# Patient Record
Sex: Male | Born: 1975 | Race: Black or African American | Hispanic: No | Marital: Single | Smoking: Never smoker
Health system: Southern US, Community
[De-identification: ages and names within clinical notes are randomized; demographics above are authoritative.]

## PROBLEM LIST (undated history)

## (undated) DIAGNOSIS — D689 Coagulation defect, unspecified: Secondary | ICD-10-CM

## (undated) DIAGNOSIS — F329 Major depressive disorder, single episode, unspecified: Secondary | ICD-10-CM

## (undated) DIAGNOSIS — Z86711 Personal history of pulmonary embolism: Secondary | ICD-10-CM

## (undated) DIAGNOSIS — D6859 Other primary thrombophilia: Principal | ICD-10-CM

## (undated) DIAGNOSIS — F429 Obsessive-compulsive disorder, unspecified: Secondary | ICD-10-CM

## (undated) DIAGNOSIS — I2699 Other pulmonary embolism without acute cor pulmonale: Secondary | ICD-10-CM

## (undated) DIAGNOSIS — F419 Anxiety disorder, unspecified: Secondary | ICD-10-CM

## (undated) DIAGNOSIS — F32A Depression, unspecified: Secondary | ICD-10-CM

## (undated) HISTORY — DX: Obsessive-compulsive disorder, unspecified: F42.9

## (undated) HISTORY — DX: Other primary thrombophilia: D68.59

## (undated) HISTORY — DX: Anxiety disorder, unspecified: F41.9

## (undated) HISTORY — DX: Coagulation defect, unspecified: D68.9

## (undated) HISTORY — DX: Major depressive disorder, single episode, unspecified: F32.9

## (undated) HISTORY — DX: Depression, unspecified: F32.A

## (undated) HISTORY — DX: Personal history of pulmonary embolism: Z86.711

## (undated) HISTORY — DX: Other pulmonary embolism without acute cor pulmonale: I26.99

## (undated) HISTORY — PX: PILONIDAL CYST EXCISION: SHX744

---

## 2017-04-01 ENCOUNTER — Ambulatory Visit (INDEPENDENT_AMBULATORY_CARE_PROVIDER_SITE_OTHER): Payer: Medicare Other | Admitting: Emergency Medicine

## 2017-04-01 VITALS — BP 115/74 | HR 93 | Temp 98.4°F | Resp 18 | Wt 212.8 lb

## 2017-04-01 DIAGNOSIS — D689 Coagulation defect, unspecified: Secondary | ICD-10-CM

## 2017-04-01 DIAGNOSIS — R29898 Other symptoms and signs involving the musculoskeletal system: Secondary | ICD-10-CM

## 2017-04-01 NOTE — Patient Instructions (Signed)
     IF you received an x-ray today, you will receive an invoice from [facility]. Please contact [facility] at [phone] with questions or concerns regarding your invoice.   IF you received labwork today, you will receive an invoice from [facility]. Please contact [facility] at [phone] with questions or concerns regarding your invoice.   Our billing staff will not be able to assist you with questions regarding bills from these companies.  You will be contacted with the lab results as soon as they are available. The fastest way to get your results is to activate your My Chart account. Instructions are located on the last page of this paperwork. If you have not heard from us regarding the results in 2 weeks, please contact this office.     

## 2017-04-01 NOTE — Progress Notes (Signed)
Janie Morning 41 y.o.   Chief Complaint  Patient presents with  . Arm Pain    right arm   feels weak/numb x week     HISTORY OF PRESENT ILLNESS: This is a 41 y.o. male complaining of weakness to right arm x 1-2 weeks; right biceps area feels a gripping sensation. Denies headache or any other neurological symptoms. Has h/o clotting disorder; had PE in the past; on Coumadin for life, started 8 years ago.  HPI   Prior to Admission medications   Medication Sig Start Date End Date Taking? Authorizing Provider  alprazolam Prudy Feeler) 2 MG tablet Take 2 mg by mouth at bedtime as needed for sleep.   Yes Historical Provider, MD  buPROPion (WELLBUTRIN XL) 150 MG 24 hr tablet Take 150 mg by mouth daily.   Yes Historical Provider, MD  pantoprazole (PROTONIX) 40 MG tablet Take 40 mg by mouth daily.   Yes Historical Provider, MD  Vilazodone HCl (VIIBRYD) 40 MG TABS Take 40 mg by mouth daily.   Yes Historical Provider, MD  warfarin (COUMADIN) 1 MG tablet Take 9 mg by mouth daily.   Yes Historical Provider, MD  warfarin (COUMADIN) 6 MG tablet Take 6 mg by mouth daily.   Yes Historical Provider, MD    No Known Allergies  Patient Active Problem List   Diagnosis Date Noted  . Right arm weakness 04/01/2017  . Clotting disorder (HCC) 04/01/2017    Past Medical History:  Diagnosis Date  . Anxiety   . Clotting disorder (HCC)   . Depression   . OCD (obsessive compulsive disorder)   . Pulmonary embolism [facility])     Past Surgical History:  Procedure Laterality Date  . PILONIDAL CYST EXCISION      Social History   Social History  . Marital status: Single    Spouse name: N/A  . Number of children: N/A  . Years of education: N/A   Occupational History  . Not on file.   Social History Main Topics  . Smoking status: Never Smoker  . Smokeless tobacco: Never Used  . Alcohol use No  . Drug use: Unknown  . Sexual activity: Not on file   Other Topics Concern  . Not on file   Social  History Narrative  . No narrative on file    Family History  Problem Relation Age of Onset  . Heart disease Mother   . Hypertension Mother   . Diabetes Father   . Heart disease Sister   . Stroke Sister   . Heart disease Maternal Grandmother   . Heart disease Maternal Grandfather   . Hypertension Maternal Grandfather   . Cancer Paternal Grandmother   . Diabetes Paternal Grandmother      Review of Systems  Constitutional: Negative.  Negative for chills, fever, malaise/fatigue and weight loss.  HENT: Negative.  Negative for congestion, ear pain, nosebleeds, sinus pain and sore throat.   Eyes: Negative.  Negative for blurred vision, double vision, discharge and redness.  Respiratory: Negative.  Negative for cough and shortness of breath.   Cardiovascular: Negative.  Negative for chest pain, palpitations, claudication and leg swelling.  Gastrointestinal: Negative.  Negative for abdominal pain, diarrhea, nausea and vomiting.  Genitourinary: Negative.  Negative for hematuria.  Musculoskeletal: Negative.  Negative for back pain, myalgias and neck pain.  Skin: Negative.  Negative for rash.  Neurological: Positive for focal weakness (right arm). Negative for dizziness, tingling, tremors, sensory change, speech change, seizures, loss of consciousness and  headaches.  Endo/Heme/Allergies: Bruises/bleeds easily.  Psychiatric/Behavioral: Negative.   All other systems reviewed and are negative.  Vitals:   04/01/17 1747  BP: 115/74  Pulse: 93  Resp: 18  Temp: 98.4 F (36.9 C)     Physical Exam  Constitutional: He is oriented to person, place, and time. He appears well-developed and well-nourished.  HENT:  Head: Normocephalic and atraumatic.  Nose: Nose normal.  Mouth/Throat: Oropharynx is clear and moist. No oropharyngeal exudate.  Eyes: Conjunctivae and EOM are normal. Pupils are equal, round, and reactive to light.  Neck: Normal range of motion. Neck supple. No JVD present. No  thyromegaly present.  Cardiovascular: Normal rate, regular rhythm and normal heart sounds.   Pulmonary/Chest: Effort normal and breath sounds normal.  Abdominal: Soft. He exhibits no distension. There is no tenderness.  Musculoskeletal: Normal range of motion.  Neurological: He is alert and oriented to person, place, and time. He displays no tremor and normal reflexes. No cranial nerve deficit or sensory deficit. He exhibits normal muscle tone. Coordination normal.  Reflex Scores:      Tricep reflexes are 2+ on the right side and 2+ on the left side.      Bicep reflexes are 2+ on the right side and 2+ on the left side.      Brachioradialis reflexes are 2+ on the right side and 2+ on the left side.      Patellar reflexes are 2+ on the right side and 2+ on the left side.      Achilles reflexes are 2+ on the right side and 2+ on the left side. Mild right arm weakness 4/5; slightly decreased grip on right hand.  Skin: Skin is warm. Capillary refill takes less than 2 seconds. No rash noted.  Psychiatric: He has a normal mood and affect. His behavior is normal.  Vitals reviewed.    ASSESSMENT & PLAN: Ying was seen today for arm pain.  Diagnoses and all orders for this visit:  Right arm weakness -     Ambulatory referral to Neurology -     CT Head Wo Contrast; Future -     CT CERVICAL SPINE WO CONTRAST; Future -     VAS [place] UPPER EXTREMITY VENOUS DUPLEX; Future  Clotting disorder (HCC)      Edwina Barth, MD [facility]

## 2017-04-02 ENCOUNTER — Ambulatory Visit: Payer: Self-pay

## 2017-04-06 ENCOUNTER — Encounter: Payer: Self-pay | Admitting: Emergency Medicine

## 2017-04-09 ENCOUNTER — Encounter: Payer: Self-pay | Admitting: Neurology

## 2017-04-09 ENCOUNTER — Encounter: Payer: Self-pay | Admitting: Emergency Medicine

## 2017-04-12 ENCOUNTER — Ambulatory Visit
Admission: RE | Admit: 2017-04-12 | Discharge: 2017-04-12 | Disposition: A | Discharge status: Home / Self Care | Payer: Medicare Other | Source: Ambulatory Visit | Attending: Emergency Medicine | Admitting: Emergency Medicine

## 2017-04-12 DIAGNOSIS — R29898 Other symptoms and signs involving the musculoskeletal system: Secondary | ICD-10-CM

## 2017-04-13 NOTE — Telephone Encounter (Signed)
He had CT head and neck done this week and was all normal. No hematomas.Can we expedite the Neuro consult? Thanks.

## 2017-04-15 ENCOUNTER — Encounter: Payer: Self-pay | Admitting: Emergency Medicine

## 2017-04-15 NOTE — Telephone Encounter (Signed)
Give him the choice if he doesn't mind the traveling. Otherwise, OK to wait as long as there are no new symptoms.

## 2017-04-17 NOTE — Telephone Encounter (Signed)
Can we schedule this ultrasound?

## 2017-04-19 ENCOUNTER — Ambulatory Visit (HOSPITAL_COMMUNITY)
Admission: RE | Admit: 2017-04-19 | Discharge: 2017-04-19 | Disposition: A | Discharge status: Home / Self Care | Payer: Medicare Other | Source: Ambulatory Visit | Attending: Emergency Medicine | Admitting: Emergency Medicine

## 2017-04-19 ENCOUNTER — Other Ambulatory Visit: Payer: Self-pay | Admitting: Emergency Medicine

## 2017-04-19 DIAGNOSIS — R52 Pain, unspecified: Secondary | ICD-10-CM | POA: Diagnosis not present

## 2017-04-19 DIAGNOSIS — M79601 Pain in right arm: Secondary | ICD-10-CM | POA: Diagnosis not present

## 2017-04-19 DIAGNOSIS — R531 Weakness: Secondary | ICD-10-CM | POA: Diagnosis not present

## 2017-04-22 ENCOUNTER — Other Ambulatory Visit: Payer: Self-pay | Admitting: Emergency Medicine

## 2017-04-22 ENCOUNTER — Encounter: Payer: Self-pay | Admitting: Emergency Medicine

## 2017-04-22 DIAGNOSIS — D689 Coagulation defect, unspecified: Secondary | ICD-10-CM

## 2017-04-22 MED ORDER — PANTOPRAZOLE SODIUM 40 MG PO TBEC: 40.0000 mg | DELAYED_RELEASE_TABLET | Freq: Every day | ORAL | 3 refills | Status: AC

## 2017-04-22 NOTE — Telephone Encounter (Signed)
Referral order pended.  Please associate and sign, and then send MyChart message letting the patient know that you've done it!  You do not need to route the message, just sign the encounter.

## 2017-04-22 NOTE — Telephone Encounter (Signed)
Ok to refer.

## 2017-04-22 NOTE — Telephone Encounter (Signed)
Ok to refill 

## 2017-04-22 NOTE — Telephone Encounter (Signed)
Rx is pended. Please sign.  You can send the patient a my chart message, then sign the encounter.

## 2017-05-09 ENCOUNTER — Ambulatory Visit (INDEPENDENT_AMBULATORY_CARE_PROVIDER_SITE_OTHER): Payer: Medicare Other | Admitting: Emergency Medicine

## 2017-05-09 ENCOUNTER — Encounter: Payer: Self-pay | Admitting: Emergency Medicine

## 2017-05-09 VITALS — BP 116/75 | HR 87 | Temp 98.7°F | Resp 18 | Wt 212.6 lb

## 2017-05-09 DIAGNOSIS — D689 Coagulation defect, unspecified: Secondary | ICD-10-CM

## 2017-05-09 DIAGNOSIS — L723 Sebaceous cyst: Secondary | ICD-10-CM | POA: Insufficient documentation

## 2017-05-09 DIAGNOSIS — R29898 Other symptoms and signs involving the musculoskeletal system: Secondary | ICD-10-CM | POA: Diagnosis not present

## 2017-05-09 NOTE — Patient Instructions (Addendum)
     IF you received an x-ray today, you will receive an invoice from [facility]. Please contact [facility] at [phone] with questions or concerns regarding your invoice.   IF you received labwork today, you will receive an invoice from [facility]. Please contact [facility] at [phone] with questions or concerns regarding your invoice.   Our billing staff will not be able to assist you with questions regarding bills from these companies.  You will be contacted with the lab results as soon as they are available. The fastest way to get your results is to activate your My Chart account. Instructions are located on the last page of this paperwork. If you have not heard from us regarding the results in 2 weeks, please contact this office.      Epidermal Cyst An epidermal cyst is a small, painless lump under your skin. It may be called an epidermal inclusion cyst or an infundibular cyst. The cyst contains a grayish-white, bad-smelling substance (keratin). It is important not to pop epidermal cysts yourself. These cysts are usually harmless (benign), but they can get infected. Symptoms of infection may include:  Redness.  Inflammation.  Tenderness.  Warmth.  Fever.  A grayish-white, bad-smelling substance draining from the cyst.  Pus draining from the cyst. Follow these instructions at home:  Take over-the-counter and prescription medicines only as told by your doctor.  If you were prescribed an antibiotic, use it as told by your doctor. Do not stop using the antibiotic even if you start to feel better.  Keep the area around your cyst clean and dry.  Wear loose, dry clothing.  Do not try to pop your cyst.  Avoid touching your cyst.  Check your cyst every day for signs of infection.  Keep all follow-up visits as told by your doctor. This is important. How is this prevented?  Wear clean, dry, clothing.  Avoid wearing tight clothing.  Keep your skin  clean and dry. Shower or take baths every day.  Wash your body with a benzoyl peroxide wash when you shower or bathe. Contact a health care provider if:  Your cyst has symptoms of infection.  Your condition is not improving or is getting worse.  You have a cyst that looks different from other cysts you have had.  You have a fever. Get help right away if:  Redness spreads from the cyst into the surrounding area. This information is not intended to replace advice given to you by your health care provider. Make sure you discuss any questions you have with your health care provider. Document Released: 01/17/2005 Document Revised: 08/08/2016 Document Reviewed: 10/12/2015 Elsevier Interactive Patient Education  2017 [employer]

## 2017-05-09 NOTE — Progress Notes (Signed)
Scott Hopkins 41 y.o.   Chief Complaint  Patient presents with  . Results    discuss CT results of arm   . Referral    dermatologist and hematologist   . Depression    depression screening was an 8     HISTORY OF PRESENT ILLNESS: This is a 41 y.o. male seen by me 04/01/17 for right arm weakness; had testing done; still not seen by Neuro but symptoms better; had normal CT brain and C-spine; normal right arm ultrasound; requesting Derm and Heme referrals for facial sebaceous cyst and chronic clotting disorder.  HPI   Prior to Admission medications   Medication Sig Start Date End Date Taking? Authorizing Provider  alprazolam Prudy Feeler) 2 MG tablet Take 2 mg by mouth at bedtime as needed for sleep.   Yes [provider]  buPROPion (WELLBUTRIN XL) 150 MG 24 hr tablet Take 150 mg by mouth daily.   Yes [provider]  pantoprazole (PROTONIX) 40 MG tablet Take 1 tablet (40 mg total) by mouth daily. 04/22/17 05/22/17 Yes Locklan Canoy, Eilleen Kempf, MD  Vilazodone HCl (VIIBRYD) 40 MG TABS Take 40 mg by mouth daily.   Yes [provider]  warfarin (COUMADIN) 1 MG tablet Take 9 mg by mouth daily.   Yes [provider]  warfarin (COUMADIN) 6 MG tablet Take 6 mg by mouth daily.   Yes [provider]    No Known Allergies  Patient Active Problem List   Diagnosis Date Noted  . Right arm weakness 04/01/2017  . Clotting disorder (HCC) 04/01/2017    Past Medical History:  Diagnosis Date  . Anxiety   . Clotting disorder (HCC)   . Depression   . OCD (obsessive compulsive disorder)   . Pulmonary embolism [facility])     Past Surgical History:  Procedure Laterality Date  . PILONIDAL CYST EXCISION      Social History   Social History  . Marital status: Single    Spouse name: N/A  . Number of children: N/A  . Years of education: N/A   Occupational History  . Not on file.   Social History Main Topics  . Smoking status: Never Smoker  .  Smokeless tobacco: Never Used  . Alcohol use No  . Drug use: Unknown  . Sexual activity: Not on file   Other Topics Concern  . Not on file   Social History Narrative  . No narrative on file    Family History  Problem Relation Age of Onset  . Heart disease Mother   . Hypertension Mother   . Diabetes Father   . Heart disease Sister   . Stroke Sister   . Heart disease Maternal Grandmother   . Heart disease Maternal Grandfather   . Hypertension Maternal Grandfather   . Cancer Paternal Grandmother   . Diabetes Paternal Grandmother      Review of Systems  Constitutional: Negative for chills, fever and malaise/fatigue.  HENT: Negative.   Eyes: Negative.   Respiratory: Negative.  Negative for cough and shortness of breath.   Cardiovascular: Negative for chest pain and palpitations.  Gastrointestinal: Negative for abdominal pain, diarrhea, nausea and vomiting.  Genitourinary: Negative for dysuria and hematuria.  Musculoskeletal: Negative for back pain, myalgias and neck pain.  Skin: Negative for rash.  Neurological: Negative for dizziness, sensory change, focal weakness and headaches.  All other systems reviewed and are negative.  Vitals:   05/09/17 1420  BP: 116/75  Pulse: 87  Resp: 18  Temp: 98.7 F (37.1 C)     Physical Exam  Constitutional: He is oriented to person, place, and time. He appears well-developed and well-nourished.  HENT:  Head: Normocephalic and atraumatic.  Right Ear: External ear normal.  Left Ear: External ear normal.  Nose: Nose normal.  Mouth/Throat: Oropharynx is clear and moist. No oropharyngeal exudate.  Eyes: Conjunctivae and EOM are normal. Pupils are equal, round, and reactive to light.  Neck: Normal range of motion. Neck supple. No JVD present. No thyromegaly present.  Cardiovascular: Normal rate, regular rhythm, normal heart sounds and intact distal pulses.   Pulmonary/Chest: Effort normal and breath sounds normal.  Abdominal: Soft.  Bowel sounds are normal. He exhibits no distension. There is no tenderness.  Musculoskeletal: Normal range of motion.  Lymphadenopathy:    He has no cervical adenopathy.  Neurological: He is alert and oriented to person, place, and time. No sensory deficit. He exhibits normal muscle tone. Coordination normal.  Skin: Skin is warm and dry. Capillary refill takes less than 2 seconds. No rash noted.  +sebaceous cyst to left frontal hairline area; no fluctuation.  Psychiatric: He has a normal mood and affect. His behavior is normal.  Vitals reviewed.    ASSESSMENT & PLAN: Scott Hopkins was seen today for results, referral and depression.  Diagnoses and all orders for this visit:  Sebaceous cyst Comments: forehead Orders: -     Ambulatory referral to Dermatology  Clotting disorder [facility]) -     Ambulatory referral to Hematology  Right arm weakness Comments: much improved    Patient Instructions       IF you received an x-ray today, you will receive an invoice from [facility]. Please contact [facility] at [phone] with questions or concerns regarding your invoice.   IF you received labwork today, you will receive an invoice from [facility]. Please contact [facility] at [phone] with questions or concerns regarding your invoice.   Our billing staff will not be able to assist you with questions regarding bills from these companies.  You will be contacted with the lab results as soon as they are available. The fastest way to get your results is to activate your My Chart account. Instructions are located on the last page of this paperwork. If you have not heard from [place] regarding the results in 2 weeks, please contact this office.      Epidermal Cyst An epidermal cyst is a small, painless lump under your skin. It may be called an epidermal inclusion cyst or an infundibular cyst. The cyst contains a grayish-white, bad-smelling substance (keratin). It is important  not to pop epidermal cysts yourself. These cysts are usually harmless (benign), but they can get infected. Symptoms of infection may include:  Redness.  Inflammation.  Tenderness.  Warmth.  Fever.  A grayish-white, bad-smelling substance draining from the cyst.  Pus draining from the cyst. Follow these instructions at home:  Take over-the-counter and prescription medicines only as told by your doctor.  If you were prescribed an antibiotic, use it as told by your doctor. Do not stop using the antibiotic even if you start to feel better.  Keep the area around your cyst clean and dry.  Wear loose, dry clothing.  Do not try to pop your cyst.  Avoid touching your cyst.  Check your cyst every day for signs of infection.  Keep all follow-up visits as told by your doctor. This is important. How is this prevented?  Wear clean, dry, clothing.  Avoid wearing  tight clothing.  Keep your skin clean and dry. Shower or take baths every day.  Wash your body with a benzoyl peroxide wash when you shower or bathe. Contact a health care provider if:  Your cyst has symptoms of infection.  Your condition is not improving or is getting worse.  You have a cyst that looks different from other cysts you have had.  You have a fever. Get help right away if:  Redness spreads from the cyst into the surrounding area. This information is not intended to replace advice given to you by your health care provider. Make sure you discuss any questions you have with your health care provider. Document Released: 01/17/2005 Document Revised: 08/08/2016 Document Reviewed: 10/12/2015 Elsevier Interactive Patient Education  2017 [employer] Inc.      Edwina BarthMiguel Aamya Orellana, MD [facility]

## 2017-05-25 ENCOUNTER — Encounter: Payer: Self-pay | Admitting: Internal Medicine

## 2017-05-25 ENCOUNTER — Telehealth: Payer: Self-pay | Admitting: Internal Medicine

## 2017-05-25 NOTE — Telephone Encounter (Signed)
Appt has been scheduled for the pt to see Dr. Arbutus PedMohamed on 7/3 at 345pm. Sent a msg to the referring office to notify the pt. Letter mailed.

## 2017-06-12 ENCOUNTER — Ambulatory Visit (INDEPENDENT_AMBULATORY_CARE_PROVIDER_SITE_OTHER): Payer: Medicare Other | Admitting: Neurology

## 2017-06-12 ENCOUNTER — Encounter: Payer: Self-pay | Admitting: Neurology

## 2017-06-12 VITALS — BP 104/74 | HR 94 | Ht 73.0 in | Wt 210.4 lb

## 2017-06-12 DIAGNOSIS — R202 Paresthesia of skin: Secondary | ICD-10-CM

## 2017-06-12 NOTE — Patient Instructions (Signed)
Avoid over bending at your elbow and take breaks to stretch the arm   Return to clinic as needed

## 2017-06-12 NOTE — Progress Notes (Signed)
[facility] Neurology Division Clinic Note - Initial Visit   Date: 06/12/17  Scott Hopkins MRN: [id number] DOB: 1976-07-30   Dear Dr. Alvy Bimler:  Thank you for your kind referral of Scott Hopkins for consultation of right arm weakness. Although his history is well known to you, please allow [place] to reiterate it for the purpose of our medical record. The patient was accompanied to the clinic by self.    History of Present Illness: Scott Hopkins is a 41 y.o. right-handed [place] American male with depression, OCD, PE, and anxiety presenting for evaluation of right arm weakness.    Starting around March 2018, he began having numbness over the arm and and weakness of the entire arm.  He recalls laying on the right forearm to watch TV for a prolonged time and then noticed his arm was asleep.  Symptoms lasted several months and began improving a week ago.  He felt achy pain over the biceps.  He saw his PCP for this who ordered CT head and cervical spine which was normal.  Currently, he denies any weakness or numbness/tingling.  He has been going to the gym and reports being able to do bicep curls with the same weights as he previously was doing.    He has a history of severe OCD and has been hospitalized for this as well as depression.  He has had ECT in the past.  He has many questions regarding the medical necessity of pharmacological treatment, side effects of each, and whether he needs to be on these indefinitely.  I requested that he discuss these concerns with his psychiatrist.  Out-side paper records, electronic medical record, and images have been reviewed where available and summarized as:  CT head and cervical spine 04/12/2017: 1. No acute intracranial abnormality. 2. No acute fracture or static subluxation of the cervical spine.  Past Medical History:  Diagnosis Date  . Anxiety   . Clotting disorder (HCC)   . Depression   . OCD (obsessive compulsive  disorder)   . Pulmonary embolism [facility])     Past Surgical History:  Procedure Laterality Date  . PILONIDAL CYST EXCISION       Medications:  Outpatient Encounter Prescriptions as of 06/12/2017  Medication Sig Note  . alprazolam (XANAX) 2 MG tablet Take 2 mg by mouth at bedtime as needed for sleep.   Marland Kitchen buPROPion (WELLBUTRIN XL) 150 MG 24 hr tablet Take 150 mg by mouth daily.   . Vilazodone HCl (VIIBRYD) 40 MG TABS Take 40 mg by mouth daily.   Marland Kitchen warfarin (COUMADIN) 1 MG tablet Take 9 mg by mouth daily. 04/01/2017: Takes 9mg  three times a week   . warfarin (COUMADIN) 6 MG tablet Take 6 mg by mouth daily. 04/01/2017: Takes 6 mg three days a week   . pantoprazole (PROTONIX) 40 MG tablet Take 1 tablet (40 mg total) by mouth daily.    No facility-administered encounter medications on file as of 06/12/2017.      Allergies: No Known Allergies  Family History: Family History  Problem Relation Age of Onset  . Heart disease Mother   . Hypertension Mother   . Diabetes Father   . Heart disease Sister   . Stroke Sister   . Heart disease Maternal Grandmother   . Heart disease Maternal Grandfather   . Hypertension Maternal Grandfather   . Cancer Paternal Grandmother   . Diabetes Paternal Grandmother     Social History: Social History  Substance Use Topics  .  Smoking status: Never Smoker  . Smokeless tobacco: Never Used  . Alcohol use No   Social History   Social History Narrative   Lives with mom in a one story home.  No children.  Education: college.     Review of Systems:  CONSTITUTIONAL: No fevers, chills, night sweats, or weight loss.   EYES: No visual changes or eye pain ENT: No hearing changes.  No history of nose bleeds.   RESPIRATORY: No cough, wheezing and shortness of breath.   CARDIOVASCULAR: Negative for chest pain, and palpitations.   GI: Negative for abdominal discomfort, blood in stools or black stools.  No recent change in bowel habits.   GU:  No history of  incontinence.   MUSCLOSKELETAL: No history of joint pain or swelling.  No myalgias.   SKIN: Negative for lesions, rash, and itching.   HEMATOLOGY/ONCOLOGY: Negative for prolonged bleeding, bruising easily, and swollen nodes.  No history of cancer.   ENDOCRINE: Negative for cold or heat intolerance, polydipsia or goiter.   PSYCH:  +depression +anxiety symptoms.   NEURO: As Above.   Vital Signs:  BP 104/74   Pulse 94   Ht 6\' 1"  (1.854 m)   Wt 210 lb 7 oz (95.5 kg)   SpO2 97%   BMI 27.76 kg/m    General Medical Exam:   General:  Well appearing, anxious, comfortable.   Eyes/ENT: see cranial nerve examination.   Neck: No masses appreciated.  Full range of motion without tenderness.  No carotid bruits. Respiratory:  Clear to auscultation, good air entry bilaterally.   Cardiac:  Regular rate and rhythm, no murmur.   Extremities:  No deformities, edema, or skin discoloration.  Skin:  No rashes or lesions.  Neurological Exam: MENTAL STATUS including orientation to time, place, person, recent and remote memory, attention span and concentration, language, and fund of knowledge is normal.  Speech is not dysarthric.  CRANIAL NERVES: II:  No visual field defects.  III-IV-VI: Pupils equal round and reactive to light.  Normal conjugate, extra-ocular eye movements in all directions of gaze.  No nystagmus.  No ptosis.   V:  Normal facial sensation.    VII:  Normal facial symmetry and movements.  No pathologic facial reflexes.  VIII:  Normal hearing and vestibular function.   IX-X:  Normal palatal movement.   XI:  Normal shoulder shrug and head rotation.   XII:  Normal tongue strength and range of motion, no deviation or fasciculation.  MOTOR:  No atrophy, fasciculations or abnormal movements.  No pronator drift.  Tone is normal.    Right Upper Extremity:    Left Upper Extremity:    Deltoid  5/5   Deltoid  5/5   Biceps  5/5   Biceps  5/5   Triceps  5/5   Triceps  5/5   Wrist extensors  5/5    Wrist extensors  5/5   Wrist flexors  5/5   Wrist flexors  5/5   Finger extensors  5/5   Finger extensors  5/5   Finger flexors  5/5   Finger flexors  5/5   Dorsal interossei  5/5   Dorsal interossei  5/5   Abductor pollicis  5/5   Abductor pollicis  5/5   Tone (Ashworth scale)  0  Tone (Ashworth scale)  0   Right Lower Extremity:    Left Lower Extremity:    Hip flexors  5/5   Hip flexors  5/5   Hip extensors  5/5   Hip extensors  5/5   Knee flexors  5/5   Knee flexors  5/5   Knee extensors  5/5   Knee extensors  5/5   Dorsiflexors  5/5   Dorsiflexors  5/5   Plantarflexors  5/5   Plantarflexors  5/5   Toe extensors  5/5   Toe extensors  5/5   Toe flexors  5/5   Toe flexors  5/5   Tone (Ashworth scale)  0  Tone (Ashworth scale)  0   MSRs:  Right                                                                 Left brachioradialis 2+  brachioradialis 2+  biceps 2+  biceps 2+  triceps 2+  triceps 2+  patellar 2+  patellar 2+  ankle jerk 2+  ankle jerk 2+  Hoffman no  Hoffman no  plantar response down  plantar response down   SENSORY:  Normal and symmetric perception of light touch, pinprick, vibration, and proprioception.  Romberg's sign absent.   COORDINATION/GAIT: Normal finger-to- nose-finger and heel-to-shin.  Intact rapid alternating movements bilaterally.  Able to rise from a chair without using arms.  Gait narrow based and stable. Tandem and stressed gait intact.    IMPRESSION: Scott Hopkins is a 41 year-old gentleman referred for evaluation of right arm weakness and paresthesias.  Today, his exam is entirely normal and non-focal.  Prior work-up has included CT head, cervical spine, and US of the arm which was normal.  I reassured him that with a normal exam, the likelihood of anything worrisome is very low.  It is possible that he may have had some ulnar entrapment at the elbow based on how he was positioned and I encouraged him to avoid hyperflexion at the elbow.  With no  motor or sensory deficits on exam, the positive predictive value of finding abnormalities on NCS/EMG is low, so this is not indicated.    He has many questions about his psychiatric medications and I kindly requested that he discuss this with his psychiatrist.    Return to clinic as needed  The duration of this appointment visit was 30 minutes of face-to-face time with the patient.  Greater than 50% of this time was spent in counseling, explanation of diagnosis, planning of further management, and coordination of care.   Thank you for allowing me to participate in patient's care.  If I can answer any additional questions, I would be pleased to do so.    Sincerely,    Donika K. Allena KatzPatel, DO

## 2017-06-25 ENCOUNTER — Telehealth: Payer: Self-pay | Admitting: Internal Medicine

## 2017-06-25 ENCOUNTER — Ambulatory Visit (HOSPITAL_BASED_OUTPATIENT_CLINIC_OR_DEPARTMENT_OTHER): Payer: Medicare Other | Admitting: Internal Medicine

## 2017-06-25 ENCOUNTER — Encounter: Payer: Self-pay | Admitting: Internal Medicine

## 2017-06-25 DIAGNOSIS — D6859 Other primary thrombophilia: Secondary | ICD-10-CM | POA: Diagnosis not present

## 2017-06-25 DIAGNOSIS — Z86711 Personal history of pulmonary embolism: Secondary | ICD-10-CM

## 2017-06-25 DIAGNOSIS — I2699 Other pulmonary embolism without acute cor pulmonale: Secondary | ICD-10-CM | POA: Diagnosis not present

## 2017-06-25 DIAGNOSIS — Z7901 Long term (current) use of anticoagulants: Secondary | ICD-10-CM

## 2017-06-25 HISTORY — DX: Other primary thrombophilia: D68.59

## 2017-06-25 HISTORY — DX: Personal history of pulmonary embolism: Z86.711

## 2017-06-25 NOTE — Progress Notes (Signed)
[facility] Telephone:([phone]) [phone]   Fax:([phone]  CONSULT NOTE  REFERRING PHYSICIAN: Edwina BarthMiguel Sagardia, MD  REASON FOR CONSULTATION:  41 years old African-American male with history of pulmonary embolism.  HPI Scott MorningMikkel K Discher is a 41 y.o. male was past medical history significant for anxiety, depression, OCD as well as history of pulmonary embolism diagnosed in 2008 when the patient [facility] complaining of chest tightness and shortness of breath and imaging studies at that time confirmed the presence of pulmonary embolism. He had hypercoagulable workup performed at that time that confirmed the presence of protein S deficiency. The patient also has a family history with her sister had a stroke at age 39. He was started on treatment with heparin followed by Coumadin since that time. He has Coumadin monitoring and he is currently on Coumadin 6 mg by mouth daily except Monday, Wednesday and Friday he takes 9 mg. His INR is usually around 2-3. [place] with a primary care physician. He was evaluated recently for questionable stroke: He has some numbness and tingling as well as weakness in his right arm. Imaging studies at that time was unremarkable. He was also seen by neurology and was advised to continue on observation. The patient requested evaluation by hematology because of his history of the protein S deficiency and pulmonary embolism and he is here today for evaluation and establish care. He denied having any complaints today except for lack of energy. He denied having any bleeding, bruises or ecchymosis. He denied having any chest pain, shortness breath, cough or hemoptysis. He has no fever or chills. Family history significant for mother with heart disease and hypertension, father had diabetes mellitus, Sr. had stroke and paternal grandmother with breast cancer. The patient is single and has  no children. 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 He has no history of smoking and drinks alcohol occasionally with no history of drug abuse.  HPI  Past Medical History:  Diagnosis Date  . Anxiety   . Clotting disorder (HCC)   . Depression   . OCD (obsessive compulsive disorder)   . Pulmonary embolism [facility](HCC)     Past Surgical History:  Procedure Laterality Date  . PILONIDAL CYST EXCISION      Family History  Problem Relation Age of Onset  . Heart disease Mother   . Hypertension Mother   . Diabetes Father   . Heart disease Sister   . Stroke Sister   . Heart disease Maternal Grandmother   . Heart disease Maternal Grandfather   . Hypertension Maternal Grandfather   . Cancer Paternal Grandmother   . Diabetes Paternal Grandmother     Social History Social History  Substance Use Topics  . Smoking status: Never Smoker  . Smokeless tobacco: Never Used  . Alcohol use No    No Known Allergies  Current Outpatient Prescriptions  Medication Sig Dispense Refill  . alprazolam (XANAX) 2 MG tablet Take 2 mg by mouth at bedtime as needed for sleep.    Marland Kitchen. buPROPion (WELLBUTRIN XL) 150 MG 24 hr tablet Take 150 mg by mouth daily.    . Vilazodone HCl (VIIBRYD) 40 MG TABS Take 40 mg by mouth daily.    Marland Kitchen. warfarin (COUMADIN) 1 MG tablet Take 9 mg by mouth daily.    Marland Kitchen. warfarin (COUMADIN) 6 MG tablet Take 6 mg by mouth daily.    .Marland Kitchen  pantoprazole (PROTONIX) 40 MG tablet Take 1 tablet (40 mg total) by mouth daily. 30 tablet 3   No current facility-administered medications for this visit.     Review of Systems  Constitutional: positive for fatigue Eyes: negative Ears, nose, mouth, throat, and face: negative Respiratory: negative Cardiovascular: negative Gastrointestinal: negative Genitourinary:negative Integument/breast: negative Hematologic/lymphatic: negative Musculoskeletal:negative Neurological: negative Behavioral/Psych: negative Endocrine:  negative Allergic/Immunologic: negative  Physical Exam  ZOX:WRUEA, healthy, no distress, well nourished and well developed SKIN: skin color, texture, turgor are normal, no rashes or significant lesions HEAD: Normocephalic, No masses, lesions, tenderness or abnormalities EYES: normal, PERRLA, Conjunctiva are pink and non-injected EARS: External ears normal, Canals clear OROPHARYNX:no exudate, no erythema and lips, buccal mucosa, and tongue normal  NECK: supple, no adenopathy, no JVD LYMPH:  no palpable lymphadenopathy, no hepatosplenomegaly LUNGS: clear to auscultation , and palpation HEART: regular rate & rhythm, no murmurs and no gallops ABDOMEN:abdomen soft, non-tender, normal bowel sounds and no masses or organomegaly BACK: Back symmetric, no curvature., No CVA tenderness, Range of motion is normal EXTREMITIES:no joint deformities, effusion, or inflammation, no edema  NEURO: alert & oriented x 3 with fluent speech, no focal motor/sensory deficits  PERFORMANCE STATUS: ECOG 1  LABORATORY DATA: No results found for: WBC, HGB, HCT, MCV, PLT    Chemistry   No results found for: NA, K, CL, CO2, BUN, CREATININE, GLU No results found for: CALCIUM, ALKPHOS, AST, ALT, BILITOT     RADIOGRAPHIC STUDIES: No results found.  ASSESSMENT:This is a very pleasant 41 years old African-American male with history of pulmonary embolism and protein S deficiency diagnosed in 2008. The patient is currently on lifelong anticoagulation with Coumadin.  PLAN: I had this discussion with the patient about his condition and treatment options. I explained to the patient that he needed to continue his anticoagulation for life. He is doing home monitoring for his INR and has been successful for the last 10 years. He will continue on the current dose of his treatment. I recommended for the patient to continue his routine follow-up visit and evaluation by his primary care physician. I will see him on as-needed  basis at this point if he has any question or concern about his anticoagulation. He was advised to call if he has any concerning symptoms. The patient voices understanding of current disease status and treatment options and is in agreement with the current care plan. All questions were answered. The patient knows to call the clinic with any problems, questions or concerns. We can certainly see the patient much sooner if necessary.  Thank you so much for allowing me to participate in the care of Scott Hopkins. I will continue to follow up the patient with you and assist in his care.  I spent 40 minutes counseling the patient face to face. The total time spent in the appointment was 60 minutes.  Disclaimer: This note was dictated with voice recognition software. Similar sounding words can inadvertently be transcribed and may not be corrected upon review.   Catilyn Boggus K. June 25, 2017, 4:02 PM

## 2017-06-25 NOTE — Telephone Encounter (Signed)
No additional appts added - per 7/3 los -Follow-up visit on as-needed basis.

## 2017-07-09 ENCOUNTER — Telehealth: Payer: Self-pay | Admitting: Emergency Medicine

## 2017-07-09 NOTE — Telephone Encounter (Signed)
[facility] is calling to verify that Scott Hopkins is officially taking over this pt's case before sending over a prescription for him to refill.  Please advise at [phone]

## 2017-07-09 NOTE — Telephone Encounter (Signed)
[place] CallasFYI. [facility] with Mabel at [facility] will be receiving home INR testing and results will faxed to our office throughout therapy. Rx will be sent here to be filled out by MD. [facility] [phone]

## 2017-07-26 ENCOUNTER — Telehealth: Payer: Self-pay | Admitting: *Deleted

## 2017-07-26 NOTE — Telephone Encounter (Signed)
[facility]. Confirmation page received at 3:41 pm.

## 2017-09-02 ENCOUNTER — Encounter: Payer: Self-pay | Admitting: Emergency Medicine

## 2017-09-03 ENCOUNTER — Other Ambulatory Visit: Payer: Self-pay | Admitting: Emergency Medicine

## 2017-09-03 MED ORDER — WARFARIN SODIUM 6 MG PO TABS: ORAL_TABLET | ORAL | 3 refills | Status: AC

## 2017-09-05 ENCOUNTER — Telehealth: Payer: Self-pay | Admitting: Emergency Medicine

## 2017-09-05 NOTE — Telephone Encounter (Signed)
[facility]AGARDIA: HE IS CALLING TO INTRODUCE HIS SELF TO DR. SAGARDIA AND TO SEE IF HE HAS ANY QUESTIONS REGARDING THIS PATIENT. BEST PHONE [phone]  MBC

## 2017-09-10 NOTE — Telephone Encounter (Signed)
See message below °

## 2017-12-18 ENCOUNTER — Encounter: Payer: Medicare Other | Admitting: Emergency Medicine

## 2017-12-25 ENCOUNTER — Encounter: Payer: Self-pay | Admitting: Emergency Medicine

## 2017-12-25 LAB — POCT INR: INR: 2.6

## 2018-02-17 LAB — PROTIME-INR

## 2018-03-28 IMAGING — CT CT HEAD W/O CM
1 series · 15 of 30 positions shown, 19 images · non-contrast
Comparison: None.

CLINICAL DATA: Right arm numbness and tingling

EXAM:
CT HEAD WITHOUT CONTRAST
CT CERVICAL SPINE WITHOUT CONTRAST
TECHNIQUE: Multidetector CT imaging of the head and cervical spine was
performed following the standard protocol without intravenous
contrast. Multiplanar CT image reconstructions of the cervical spine
were also generated.

[Series 2: head w/(date) · axial · 0.47mm/px · z∈[-151,-6]mm · 15 of 33 slices shown, 19 images]
[im 2/33  brain]
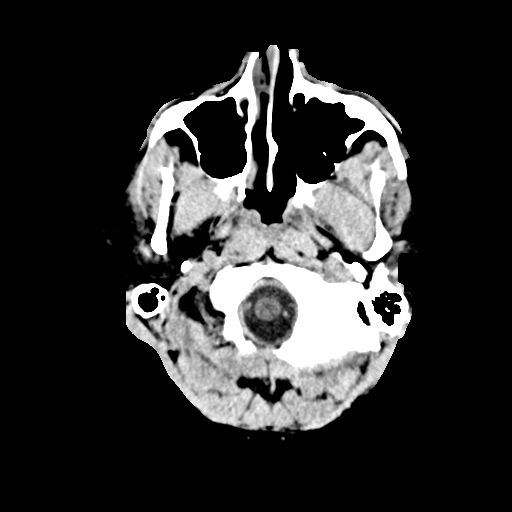
[im 2/33  bone]
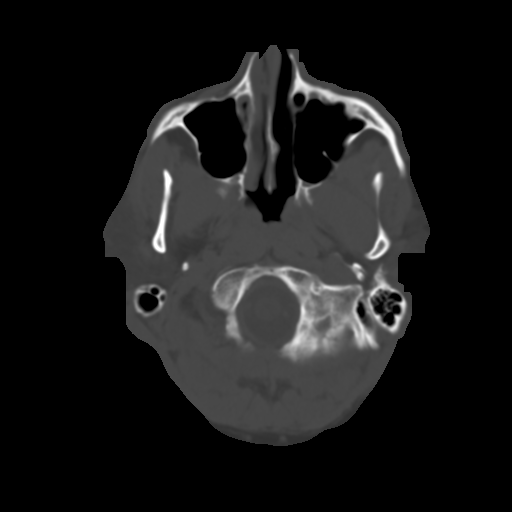
[im 4/33  brain]
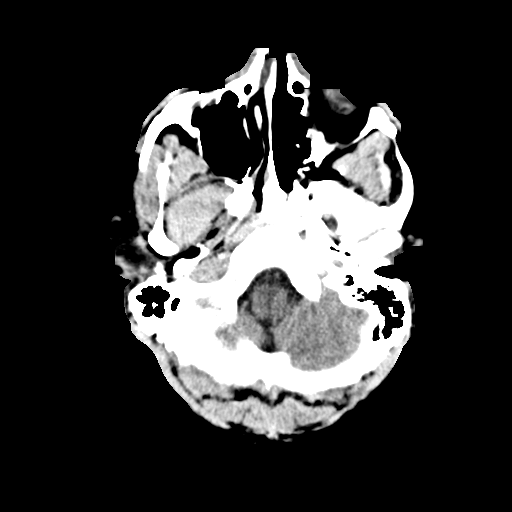
[im 6/33  brain]
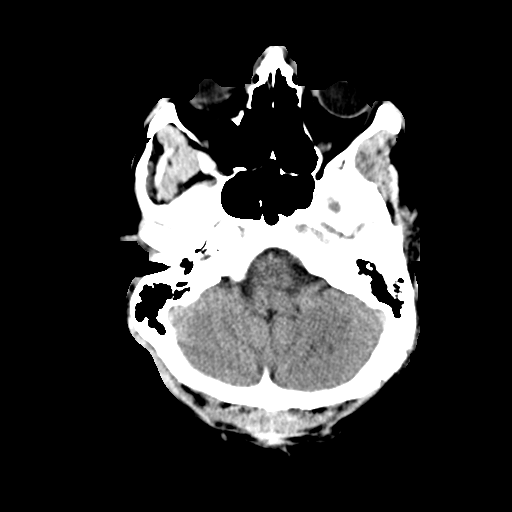
[im 8/33  brain]
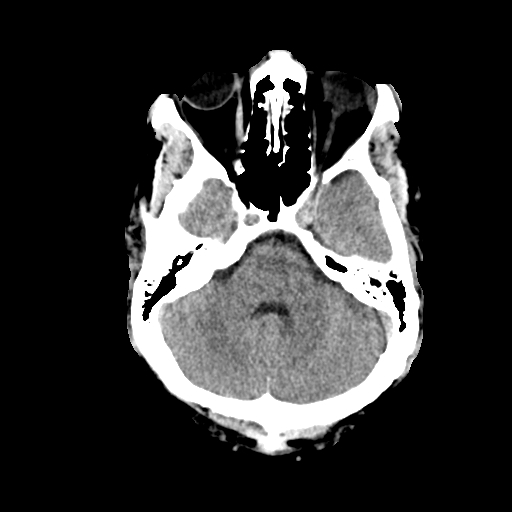
[im 10/33  brain]
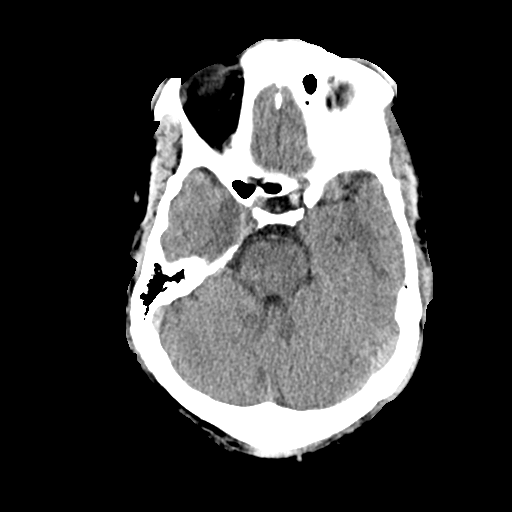
[im 10/33  bone]
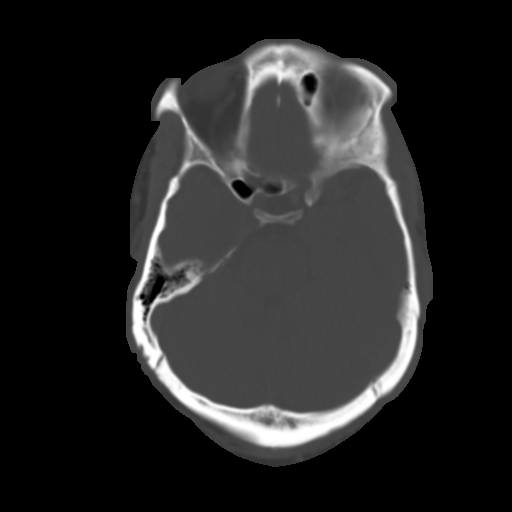
[im 13/33  brain]
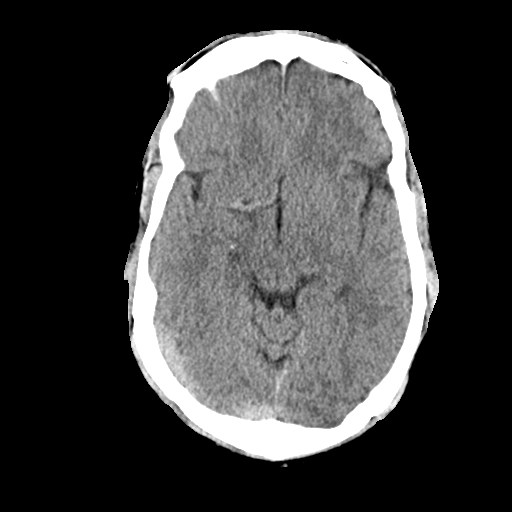
[im 15/33  brain]
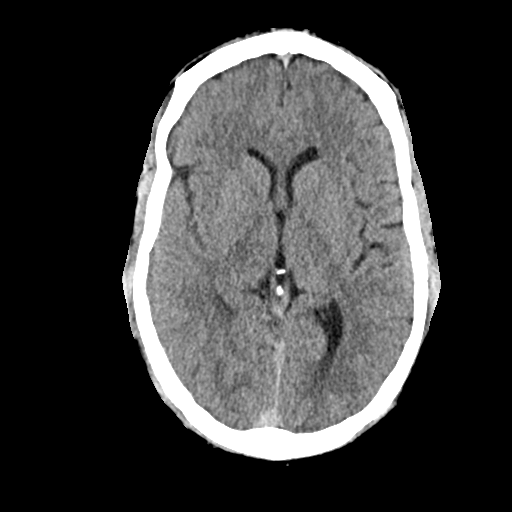
[im 17/33  brain]
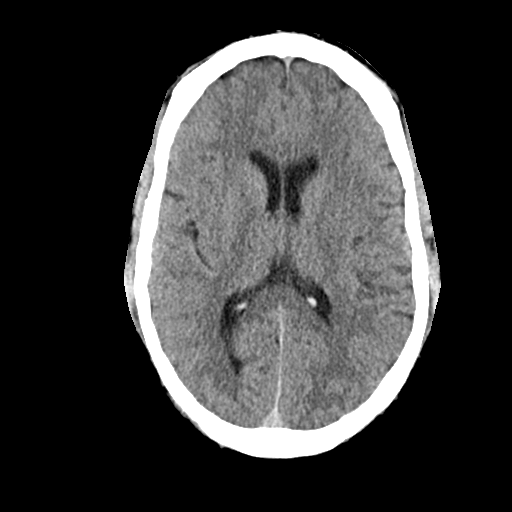
[im 18/33  brain]
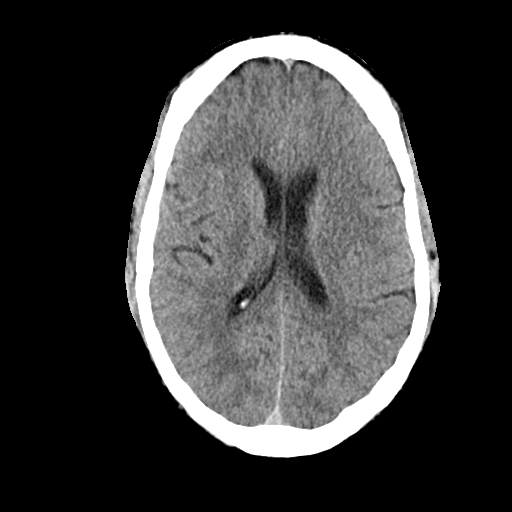
[im 18/33  bone]
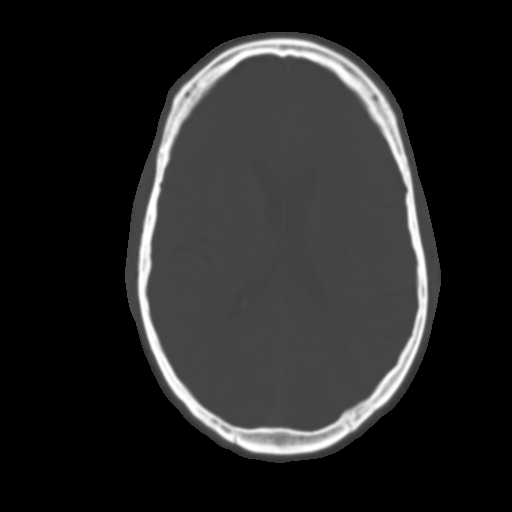
[im 20/33  brain]
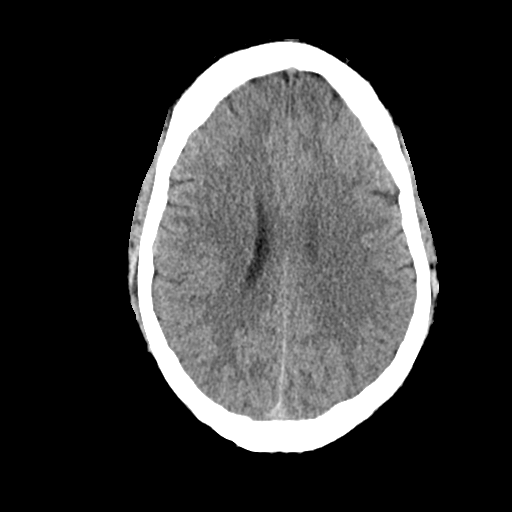
[im 23/33  brain]
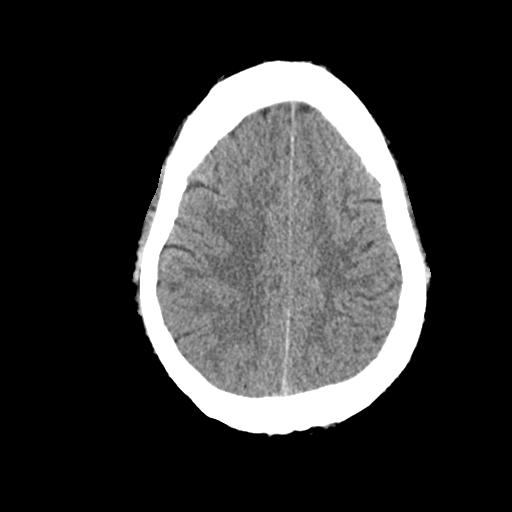
[im 25/33  brain]
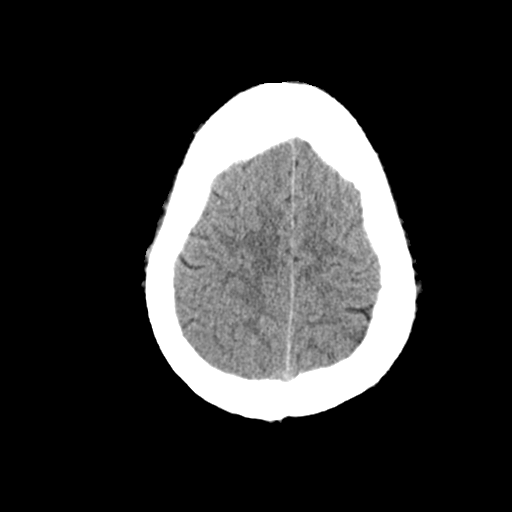
[im 27/33  brain]
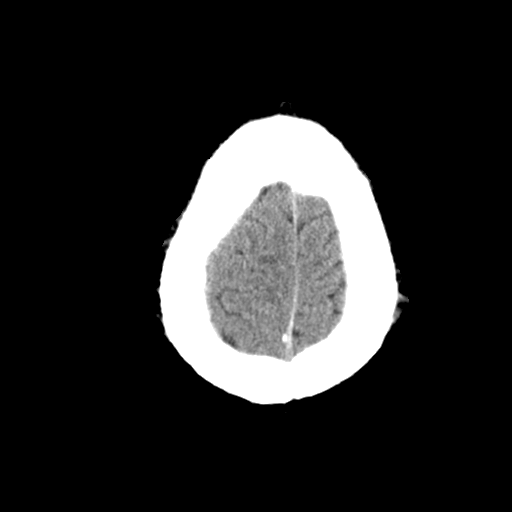
[im 27/33  bone]
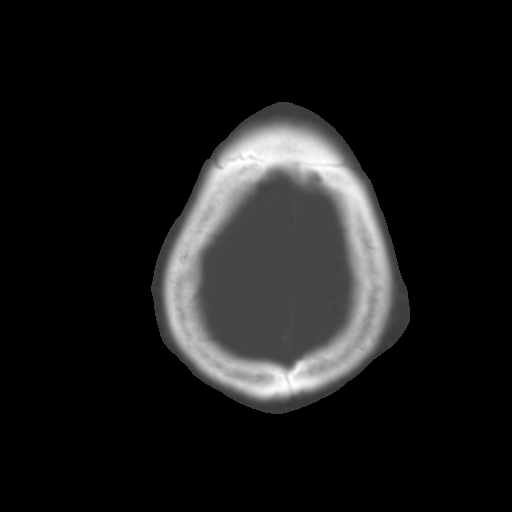
[im 29/33  brain]
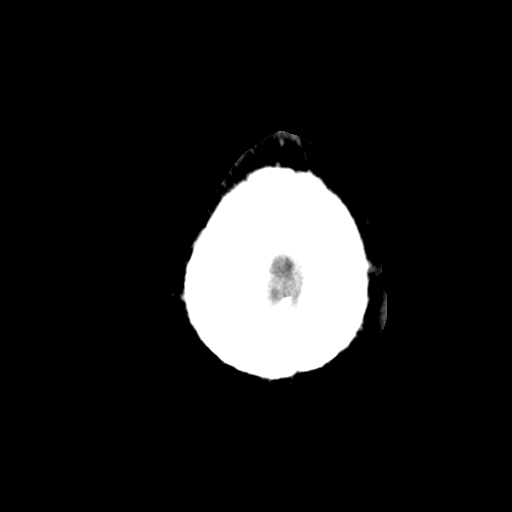
[im 31/33  brain]
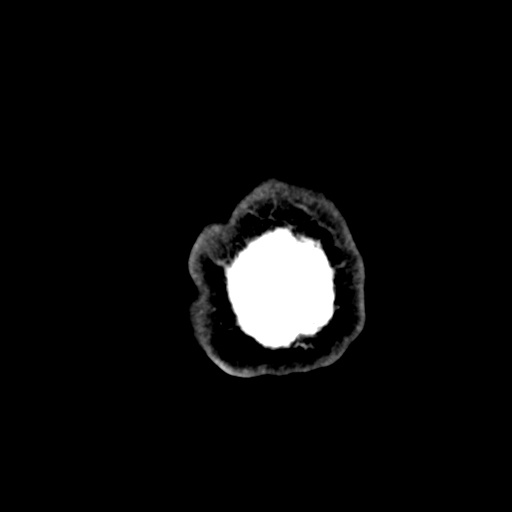

[15 of 30 positions shown; findings below may reference images not displayed]

FINDINGS: CT HEAD FINDINGS

Brain: No mass lesion, intraparenchymal hemorrhage or extra-axial
collection. No evidence of acute cortical infarct. Brain parenchyma
and CSF-containing spaces are normal for age.

Vascular: No hyperdense vessel or unexpected calcification.

Skull: Normal visualized skull base, calvarium and extracranial soft
tissues.

Sinuses/Orbits: No sinus fluid levels or advanced mucosal
thickening. No mastoid effusion. Normal orbits.

CT CERVICAL SPINE FINDINGS

Alignment: No static subluxation. Facets are aligned. Occipital
condyles are normally positioned.

Skull base and vertebrae: No acute fracture.

Soft tissues and spinal canal: No prevertebral fluid or swelling. No
visible canal hematoma.

Disc levels: No advanced spinal canal or neural foraminal stenosis.

Upper chest: No pneumothorax, pulmonary nodule or pleural effusion.

Other: Normal visualized paraspinal cervical soft tissues.
IMPRESSION: 1. No acute intracranial abnormality.
2. No acute fracture or static subluxation of the cervical spine.

## 2018-03-28 IMAGING — CT CT CERVICAL SPINE W/O CM
2 series · 10 of 14 positions shown, 12 images · non-contrast
Comparison: None.

CLINICAL DATA: Right arm numbness and tingling

EXAM:
CT HEAD WITHOUT CONTRAST
CT CERVICAL SPINE WITHOUT CONTRAST
TECHNIQUE: Multidetector CT imaging of the head and cervical spine was
performed following the standard protocol without intravenous
contrast. Multiplanar CT image reconstructions of the cervical spine
were also generated.

[Series 2: cspine soft · axial · 0.23mm/px · z∈[-208,-78]mm · 5 of 99 slices shown]
[im 17/99  soft-tissue]
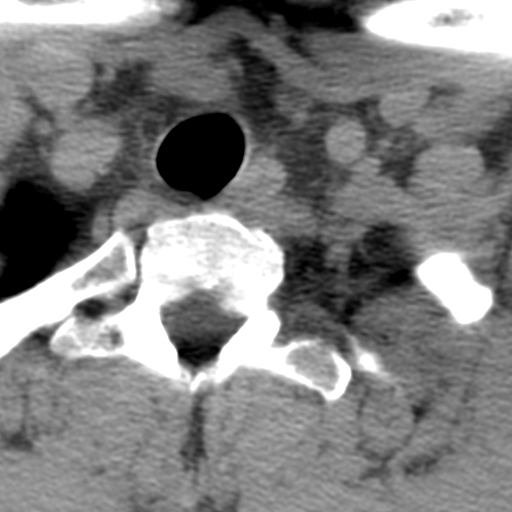
[im 33/99  soft-tissue]
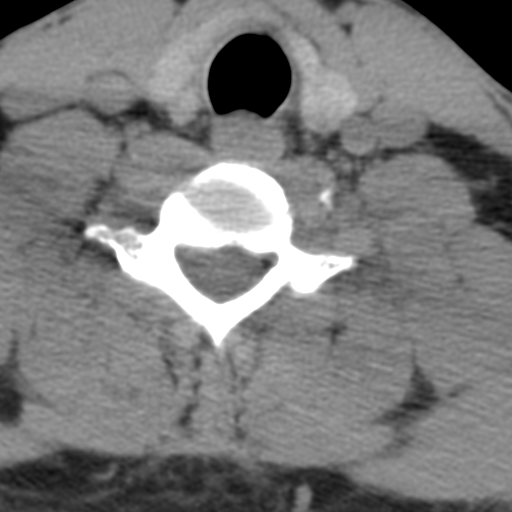
[im 50/99  soft-tissue]
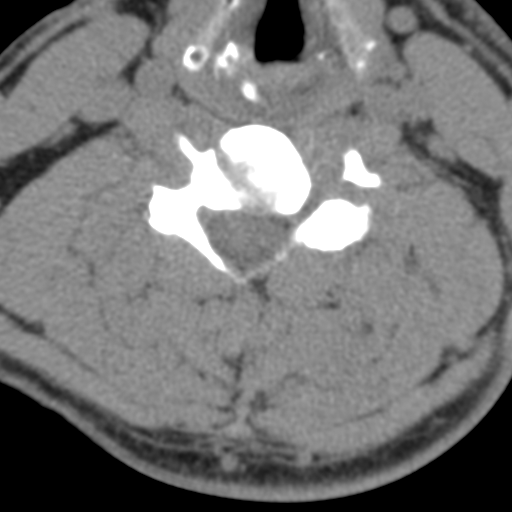
[im 66/99  soft-tissue]
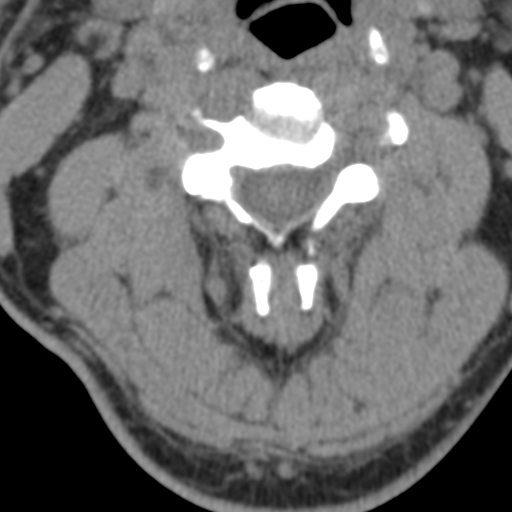
[im 82/99  soft-tissue]
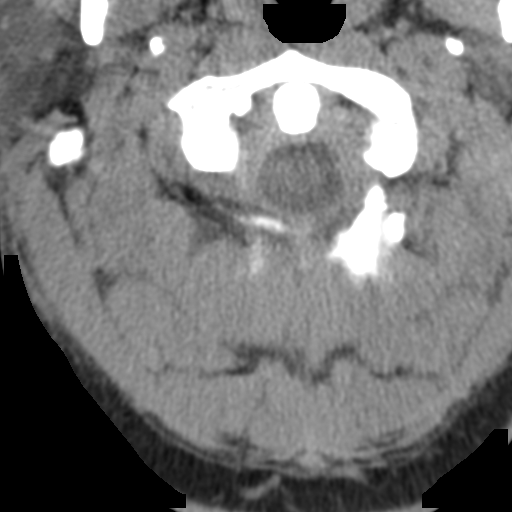

[Series 8: angled axial · axial · 0.23mm/px · z∈[-220,-100]mm · 5 of 96 slices shown, 7 images]
[im 16/96  soft-tissue]
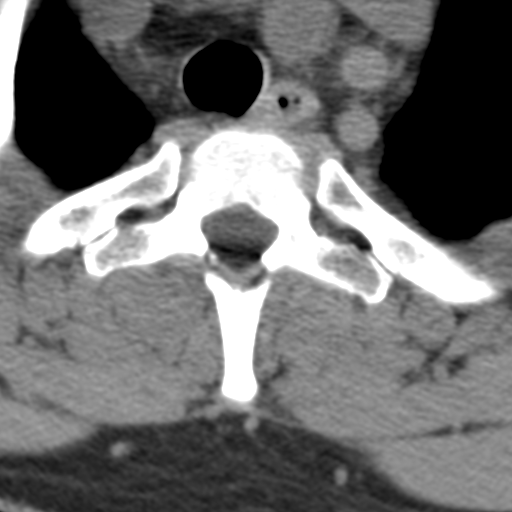
[im 16/96  bone]
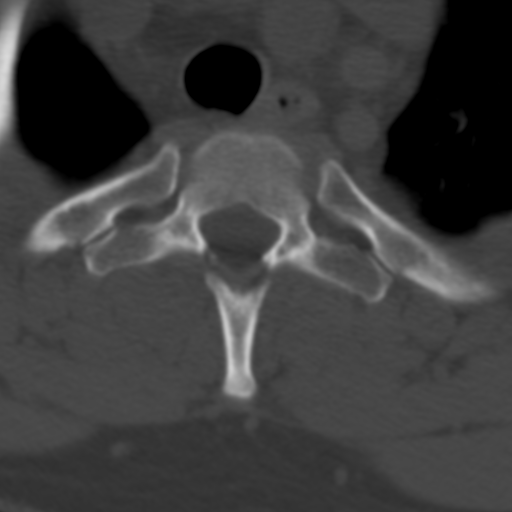
[im 32/96  bone]
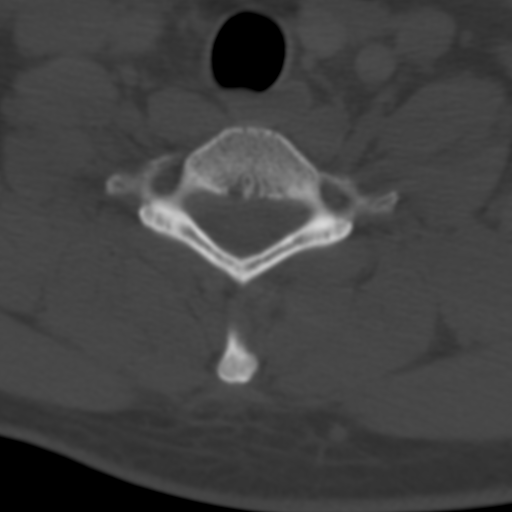
[im 48/96  bone]
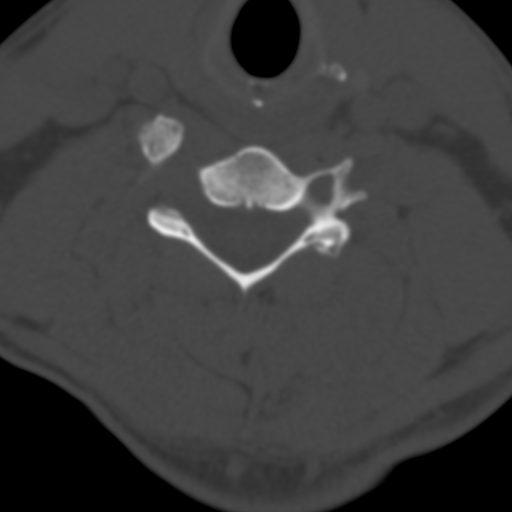
[im 64/96  bone]
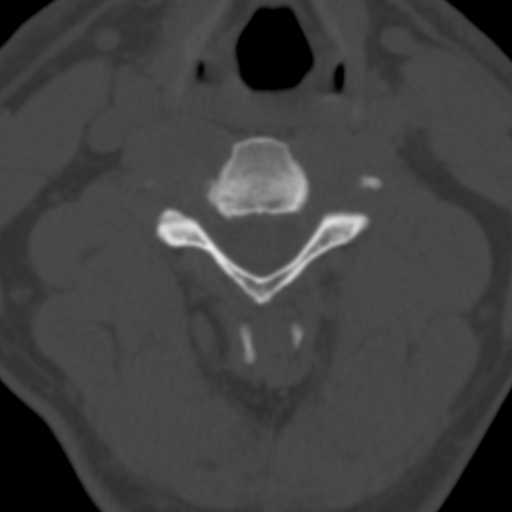
[im 80/96  soft-tissue]
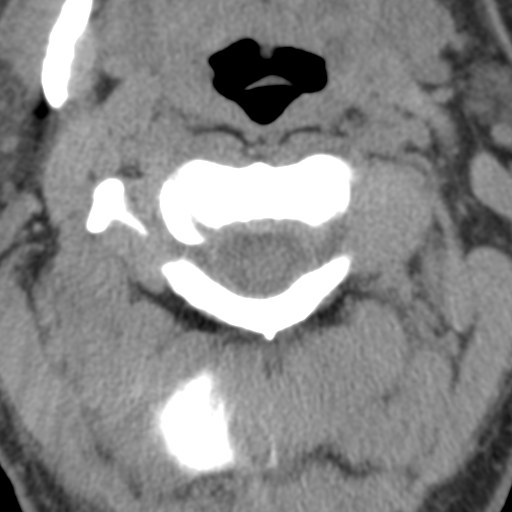
[im 80/96  bone]
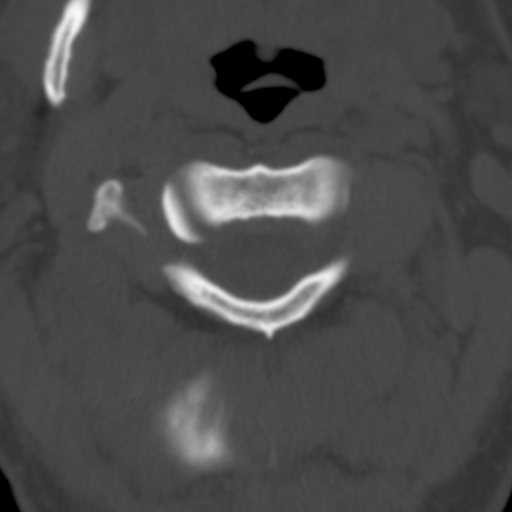

[10 of 14 positions shown; findings below may reference images not displayed]

FINDINGS: CT HEAD FINDINGS

Brain: No mass lesion, intraparenchymal hemorrhage or extra-axial
collection. No evidence of acute cortical infarct. Brain parenchyma
and CSF-containing spaces are normal for age.

Vascular: No hyperdense vessel or unexpected calcification.

Skull: Normal visualized skull base, calvarium and extracranial soft
tissues.

Sinuses/Orbits: No sinus fluid levels or advanced mucosal
thickening. No mastoid effusion. Normal orbits.

CT CERVICAL SPINE FINDINGS

Alignment: No static subluxation. Facets are aligned. Occipital
condyles are normally positioned.

Skull base and vertebrae: No acute fracture.

Soft tissues and spinal canal: No prevertebral fluid or swelling. No
visible canal hematoma.

Disc levels: No advanced spinal canal or neural foraminal stenosis.

Upper chest: No pneumothorax, pulmonary nodule or pleural effusion.

Other: Normal visualized paraspinal cervical soft tissues.
IMPRESSION: 1. No acute intracranial abnormality.
2. No acute fracture or static subluxation of the cervical spine.

## 2018-09-09 ENCOUNTER — Other Ambulatory Visit: Payer: Self-pay | Admitting: Emergency Medicine

## 2018-09-09 NOTE — Telephone Encounter (Signed)
coumadin refill Last Refill: 06/14/48 #102 Last OV: 05/09/17 PCP: Dr Alvy BimlerSagardia [facility] [address] [place]

## 2019-11-05 ENCOUNTER — Other Ambulatory Visit: Payer: Self-pay | Admitting: Emergency Medicine

## 2019-11-05 NOTE — Telephone Encounter (Signed)
Forwarding medication refill request to the clinical pool for review. 

## 2019-11-09 ENCOUNTER — Telehealth: Payer: Self-pay | Admitting: *Deleted

## 2019-11-09 NOTE — Telephone Encounter (Signed)
lvmtcb and notified pt of refill status
